# Patient Record
Sex: Male | Born: 1980 | Race: Black or African American | Hispanic: No | Marital: Single | Smoking: Current every day smoker
Health system: Southern US, Community
[De-identification: ages and names within clinical notes are randomized; demographics above are authoritative.]

---

## 2014-09-11 ENCOUNTER — Emergency Department (HOSPITAL_COMMUNITY)
Admission: EM | Admit: 2014-09-11 | Discharge: 2014-09-11 | Disposition: A | Discharge status: Home / Self Care | Payer: Self-pay | Attending: Emergency Medicine | Admitting: Emergency Medicine

## 2014-09-11 ENCOUNTER — Encounter (HOSPITAL_COMMUNITY): Payer: Self-pay

## 2014-09-11 DIAGNOSIS — M79672 Pain in left foot: Secondary | ICD-10-CM | POA: Insufficient documentation

## 2014-09-11 DIAGNOSIS — M79671 Pain in right foot: Secondary | ICD-10-CM | POA: Insufficient documentation

## 2014-09-11 DIAGNOSIS — Z720 Tobacco use: Secondary | ICD-10-CM | POA: Insufficient documentation

## 2014-09-11 LAB — CBG MONITORING, ED: Glucose-Capillary: 89 mg/dL (ref 65–99)

## 2014-09-11 MED ORDER — NAPROXEN 500 MG PO TABS: 500.0000 mg | ORAL_TABLET | Freq: Two times a day (BID) | ORAL | Status: DC

## 2014-09-11 NOTE — ED Provider Notes (Signed)
CSN: [id number]     Arrival date & time 09/11/14  1930 History   First MD Initiated Contact with Patient 09/11/14 1945     Chief Complaint  Patient presents with  . Foot Pain     (Consider location/radiation/quality/duration/timing/severity/associated sxs/prior Treatment) Patient is a 34 y.o. male presenting with lower extremity pain. The history is provided by the patient.  Foot Pain This is a chronic problem. The current episode started more than 1 year ago. The problem occurs constantly. The problem has been gradually worsening. The symptoms are aggravated by standing and walking. He has tried nothing for the symptoms.   Steve Flowers is a 34 y.o. male who presents to the ED with bilateral foot pain. He states that he has noted cracking of the skin in both heels. The left heel started about a year ago and now the right has started. He states the pain is getting worse.  He wears steel toe boots at work and is on his feet all day. He states that the pain is severe.   History reviewed. No pertinent past medical history. History reviewed. No pertinent past surgical history. No family history on file. History  Substance Use Topics  . Smoking status: Current Every Day Smoker -- 0.50 packs/day for 10 years    Types: Cigarettes  . Smokeless tobacco: Not on file  . Alcohol Use: Yes     Comment: occasional    Review of Systems Negative except as stated in HPI   Allergies  Review of patient's allergies indicates no known allergies.  Home Medications   Prior to Admission medications   Medication Sig Start Date End Date Taking? Authorizing Provider  naproxen (NAPROSYN) 500 MG tablet Take 1 tablet (500 mg total) by mouth 2 (two) times daily. 09/11/14   Hope Orlene OchM Neese, NP   BP 131/94 mmHg  Pulse 82  Temp(Src) 98.2 F (36.8 C) (Oral)  Resp 16  Ht 5\' 10"  (1.778 m)  Wt 432 lb 3 oz (196.039 kg)  BMI 62.01 kg/m2  SpO2 99% Physical Exam  Constitutional: He is oriented to person,  place, and time. No distress.  Morbidly obese  HENT:  Head: Normocephalic.  Eyes: EOM are normal.  Neck: Neck supple.  Cardiovascular: Normal rate.   Pulmonary/Chest: Effort normal.  Musculoskeletal: Normal range of motion.       Feet:  Bilateral heels dry with skin cracking and open areas. No erythema or signs of infection. Pedal pulses 2+ bilateral. Adequate circulation.   Neurological: He is alert and oriented to person, place, and time. No cranial nerve deficit.  Ambulatory without difficulty.   Skin: Skin is warm and dry.  Psychiatric: He has a normal mood and affect. His behavior is normal.  Nursing note and vitals reviewed.   ED Course  Procedures (including critical care time) Labs Review Results for orders placed or performed during the hospital encounter of 09/11/14 (from the past 24 hour(s))  POC CBG, ED     Status: None   Collection Time: 09/11/14  8:05 PM  Result Value Ref Range   Glucose-Capillary 89 65 - 99 mg/dL      MDM  34 y.o. male with bilateral heel pain and open dry areas. Stable for d/c without signs of infection. He will follow up with Dr. Charlsie Merlesegal for further evaluation of his feet.   Final diagnoses:  Heel pain, bilateral      [name], NP 09/11/14 2032  Geoffery Lyonsouglas Delo, MD 09/14/14 [phone]

## 2014-09-11 NOTE — ED Notes (Signed)
Patient states that both his feet ar cracking open on the heels. He has to wear steel toe boots at work and is on his feet a lot. Patient states that is is very painful.

## 2014-09-11 NOTE — Discharge Instructions (Signed)
Your exam today shows that the heels of your feet are dry and cracking open. They do not appear infected at this time. Be sure you are wearing shoes that fit properly. You may try the gel inserts for comfort. Apply cream to the heels at least twice a day. Call Dr. Charlsie Merlesegal for a follow up appointment. Return as needed.

## 2016-04-10 ENCOUNTER — Emergency Department (HOSPITAL_COMMUNITY): Payer: Self-pay

## 2016-04-10 ENCOUNTER — Encounter (HOSPITAL_COMMUNITY): Payer: Self-pay

## 2016-04-10 ENCOUNTER — Emergency Department (HOSPITAL_COMMUNITY)
Admission: EM | Admit: 2016-04-10 | Discharge: 2016-04-10 | Disposition: A | Discharge status: Home / Self Care | Payer: Self-pay | Attending: Emergency Medicine | Admitting: Emergency Medicine

## 2016-04-10 DIAGNOSIS — R110 Nausea: Secondary | ICD-10-CM | POA: Insufficient documentation

## 2016-04-10 DIAGNOSIS — Z79899 Other long term (current) drug therapy: Secondary | ICD-10-CM | POA: Insufficient documentation

## 2016-04-10 DIAGNOSIS — F17210 Nicotine dependence, cigarettes, uncomplicated: Secondary | ICD-10-CM | POA: Insufficient documentation

## 2016-04-10 DIAGNOSIS — R109 Unspecified abdominal pain: Secondary | ICD-10-CM

## 2016-04-10 DIAGNOSIS — R1011 Right upper quadrant pain: Secondary | ICD-10-CM | POA: Insufficient documentation

## 2016-04-10 LAB — CBC WITH DIFFERENTIAL/PLATELET
Basophils Absolute: 0 10*3/uL (ref 0.0–0.1)
Basophils Relative: 1 %
EOS PCT: 3 %
Eosinophils Absolute: 0.3 10*3/uL (ref 0.0–0.7)
HCT: 44.7 % (ref 39.0–52.0)
Hemoglobin: 15 g/dL (ref 13.0–17.0)
LYMPHS ABS: 3.1 10*3/uL (ref 0.7–4.0)
Lymphocytes Relative: 36 %
MCH: 29.6 pg (ref 26.0–34.0)
MCHC: 33.6 g/dL (ref 30.0–36.0)
MCV: 88.3 fL (ref 78.0–100.0)
MONOS PCT: 10 %
Monocytes Absolute: 0.8 10*3/uL (ref 0.1–1.0)
Neutro Abs: 4.3 10*3/uL (ref 1.7–7.7)
Neutrophils Relative %: 50 %
PLATELETS: 260 10*3/uL (ref 150–400)
RBC: 5.06 MIL/uL (ref 4.22–5.81)
RDW: 13.2 % (ref 11.5–15.5)
WBC: 8.5 10*3/uL (ref 4.0–10.5)

## 2016-04-10 LAB — COMPREHENSIVE METABOLIC PANEL
ALK PHOS: 36 U/L — AB (ref 38–126)
ALT: 28 U/L (ref 17–63)
AST: 23 U/L (ref 15–41)
Albumin: 4.2 g/dL (ref 3.5–5.0)
Anion gap: 7 (ref 5–15)
BUN: 9 mg/dL (ref 6–20)
CALCIUM: 9.1 mg/dL (ref 8.9–10.3)
CHLORIDE: 101 mmol/L (ref 101–111)
CO2: 29 mmol/L (ref 22–32)
CREATININE: 0.91 mg/dL (ref 0.61–1.24)
Glucose, Bld: 92 mg/dL (ref 65–99)
Potassium: 3.6 mmol/L (ref 3.5–5.1)
Sodium: 137 mmol/L (ref 135–145)
Total Bilirubin: 0.3 mg/dL (ref 0.3–1.2)
Total Protein: 7.5 g/dL (ref 6.5–8.1)

## 2016-04-10 LAB — LIPASE, BLOOD: LIPASE: 30 U/L (ref 11–51)

## 2016-04-10 MED ORDER — ONDANSETRON HCL 4 MG PO TABS: 4.0000 mg | ORAL_TABLET | Freq: Three times a day (TID) | ORAL | 0 refills | Status: AC | PRN

## 2016-04-10 MED ORDER — MORPHINE SULFATE (PF) 2 MG/ML IV SOLN
2.0000 mg | INTRAVENOUS | Status: DC | PRN
  Administered 2016-04-10: 2 mg via INTRAVENOUS
  Filled 2016-04-10: qty 1

## 2016-04-10 MED ORDER — IOPAMIDOL (ISOVUE-300) INJECTION 61%
125.0000 mL | Freq: Once | INTRAVENOUS | Status: AC | PRN
  Administered 2016-04-10: 125 mL via INTRAVENOUS

## 2016-04-10 MED ORDER — IOPAMIDOL (ISOVUE-300) INJECTION 61%
INTRAVENOUS | Status: AC
  Administered 2016-04-10: 30 mL via ORAL
  Filled 2016-04-10: qty 30

## 2016-04-10 MED ORDER — ONDANSETRON HCL 4 MG/2ML IJ SOLN
4.0000 mg | INTRAMUSCULAR | Status: DC | PRN
  Administered 2016-04-10: 4 mg via INTRAVENOUS
  Filled 2016-04-10: qty 2

## 2016-04-10 MED ORDER — FAMOTIDINE 20 MG PO TABS: 20.0000 mg | ORAL_TABLET | Freq: Two times a day (BID) | ORAL | 0 refills | Status: AC

## 2016-04-10 NOTE — Discharge Instructions (Signed)
Eat a bland diet, avoiding greasy, fatty, fried foods, as well as spicy and acidic foods or beverages.  Avoid eating within the hour or 2 before going to bed or laying down.  Also avoid teas, colas, coffee, chocolate, pepermint and spearment. Ttake over the counter maalox/mylanta, as directed on packaging, as needed for discomfort.  Take the prescriptions as directed.  Call your regular medical doctor and the GI doctor tomorrow to schedule a follow up appointment this week.  Return to the Emergency Department immediately if worsening.

## 2016-04-10 NOTE — ED Triage Notes (Signed)
Pt reports pain in RUQ x 1 week.  Reports nausea at times, no vomiting.  Denies diarrhea.  Reports has been constipated and has been taking laxatives.  Pt says finally had BMs yesterday.  Pt says he went 3 times.

## 2016-04-10 NOTE — ED Provider Notes (Signed)
[facility] DEPT Provider Note   CSN: [id number] Arrival date & time: 04/10/16  1446     History   Chief Complaint Chief Complaint  Patient presents with  . Abdominal Pain    HPI Steve Flowers is a 35 y.o. male.  HPI  Pt was seen at 1550.  Per pt, c/o gradual onset and worsening of constant RUQ abd "pain" for the past 4 days.  Has been associated with nausea.  Pain worsens after eating. Pt states he took a laxative and had resultant BM without change in his symptoms. Denies vomiting/diarrhea, no fevers, no back pain, no rash, no CP/SOB, no black or blood in stools.      History reviewed. No pertinent past medical history.  There are no active problems to display for this patient.   History reviewed. No pertinent surgical history.     Home Medications    Prior to Admission medications   Medication Sig Start Date End Date Taking? Authorizing Provider  naproxen (NAPROSYN) 500 MG tablet Take 1 tablet (500 mg total) by mouth 2 (two) times daily. 09/11/14   Hope Orlene OchM Neese, NP    Family History No family history on file.  Social History Social History  Substance Use Topics  . Smoking status: Current Every Day Smoker    Packs/day: 0.50    Years: 10.00    Types: Cigarettes  . Smokeless tobacco: Never Used  . Alcohol use Yes     Comment: occasional     Allergies   Patient has no known allergies.   Review of Systems Review of Systems ROS: Statement: All systems negative except as marked or noted in the HPI; Constitutional: Negative for fever and chills. ; ; Eyes: Negative for eye pain, redness and discharge. ; ; ENMT: Negative for ear pain, hoarseness, nasal congestion, sinus pressure and sore throat. ; ; Cardiovascular: Negative for chest pain, palpitations, diaphoresis, dyspnea and peripheral edema. ; ; Respiratory: Negative for cough, wheezing and stridor. ; ; Gastrointestinal: +abd pain, nausea. Negative for vomiting, diarrhea, blood in stool, hematemesis,  jaundice and rectal bleeding. . ; ; Genitourinary: Negative for dysuria, flank pain and hematuria. ; ; Musculoskeletal: Negative for back pain and neck pain. Negative for swelling and trauma.; ; Skin: Negative for pruritus, rash, abrasions, blisters, bruising and skin lesion.; ; Neuro: Negative for headache, lightheadedness and neck stiffness. Negative for weakness, altered level of consciousness, altered mental status, extremity weakness, paresthesias, involuntary movement, seizure and syncope.       Physical Exam Updated Vital Signs BP (!) 155/101 (BP Location: Left Arm)   Pulse 85   Temp 97.5 F (36.4 C) (Oral)   Resp 18   Ht 5\' 10"  (1.778 m)   Wt (!) 410 lb (186 kg)   SpO2 100%   BMI 58.83 kg/m   Physical Exam 1555: Physical examination:  Nursing notes reviewed; Vital signs and O2 SAT reviewed;  Constitutional: Well developed, Well nourished, Well hydrated, In no acute distress; Head:  Normocephalic, atraumatic; Eyes: EOMI, PERRL, No scleral icterus; ENMT: Mouth and pharynx normal, Mucous membranes moist; Neck: Supple, Full range of motion, No lymphadenopathy; Cardiovascular: Regular rate and rhythm, No gallop; Respiratory: Breath sounds clear & equal bilaterally, No wheezes.  Speaking full sentences with ease, Normal respiratory effort/excursion; Chest: Nontender, Movement normal; Abdomen: Large, Soft, +RUQ tenderness to palp. Nondistended, Normal bowel sounds; Genitourinary: No CVA tenderness; Extremities: Pulses normal, No tenderness, No edema, No calf edema or asymmetry.; Neuro: AA&Ox3, Major CN grossly intact.  Speech  clear. No gross focal motor or sensory deficits in extremities.; Skin: Color normal, Warm, Dry.   ED Treatments / Results  Labs (all labs ordered are listed, but only abnormal results are displayed)   EKG  EKG Interpretation None       Radiology   Procedures Procedures (including critical care time)  Medications Ordered in ED Medications  ondansetron  (ZOFRAN) injection 4 mg (not administered)  morphine 2 MG/ML injection 2 mg (not administered)     Initial Impression / Assessment and Plan / ED Course  I have reviewed the triage vital signs and the nursing notes.  Pertinent labs & imaging results that were available during my care of the patient were reviewed by me and considered in my medical decision making (see chart for details).  MDM Reviewed: previous chart, nursing note and vitals Reviewed previous: labs Interpretation: labs and CT scan   Results for orders placed or performed during the hospital encounter of 04/10/16  CBC with Differential  Result Value Ref Range   WBC 8.5 4.0 - 10.5 K/uL   RBC 5.06 4.22 - 5.81 MIL/uL   Hemoglobin 15.0 13.0 - 17.0 g/dL   HCT 16.1 09.6 - 04.5 %   MCV 88.3 78.0 - 100.0 fL   MCH 29.6 26.0 - 34.0 pg   MCHC 33.6 30.0 - 36.0 g/dL   RDW 40.9 81.1 - 91.4 %   Platelets 260 150 - 400 K/uL   Neutrophils Relative % 50 %   Neutro Abs 4.3 1.7 - 7.7 K/uL   Lymphocytes Relative 36 %   Lymphs Abs 3.1 0.7 - 4.0 K/uL   Monocytes Relative 10 %   Monocytes Absolute 0.8 0.1 - 1.0 K/uL   Eosinophils Relative 3 %   Eosinophils Absolute 0.3 0.0 - 0.7 K/uL   Basophils Relative 1 %   Basophils Absolute 0.0 0.0 - 0.1 K/uL  Comprehensive metabolic panel  Result Value Ref Range   Sodium 137 135 - 145 mmol/L   Potassium 3.6 3.5 - 5.1 mmol/L   Chloride 101 101 - 111 mmol/L   CO2 29 22 - 32 mmol/L   Glucose, Bld 92 65 - 99 mg/dL   BUN 9 6 - 20 mg/dL   Creatinine, Ser 7.82 0.61 - 1.24 mg/dL   Calcium 9.1 8.9 - 95.6 mg/dL   Total Protein 7.5 6.5 - 8.1 g/dL   Albumin 4.2 3.5 - 5.0 g/dL   AST 23 15 - 41 U/L   ALT 28 17 - 63 U/L   Alkaline Phosphatase 36 (L) 38 - 126 U/L   Total Bilirubin 0.3 0.3 - 1.2 mg/dL   GFR calc non Af Amer >60 >60 mL/min   GFR calc Af Amer >60 >60 mL/min   Anion gap 7 5 - 15  Lipase, blood  Result Value Ref Range   Lipase 30 11 - 51 U/L   Ct Abdomen Pelvis W Contrast Result  Date: 04/10/2016 CLINICAL DATA:  Right abdominal pain for 1 week. EXAM: CT ABDOMEN AND PELVIS WITH CONTRAST TECHNIQUE: Multidetector CT imaging of the abdomen and pelvis was performed using the standard protocol following bolus administration of intravenous contrast. CONTRAST:  125 cc ISOVUE-300 IOPAMIDOL (ISOVUE-300) INJECTION 61%, ISOVUE-300 IOPAMIDOL (ISOVUE-300) INJECTION 61% COMPARISON:  None. FINDINGS: Lower chest: No significant pulmonary nodules or acute consolidative airspace disease. Hepatobiliary: Diffuse hepatic steatosis. Normal liver size. No liver mass. No definite liver surface irregularity. Normal gallbladder with no radiopaque cholelithiasis. No biliary ductal dilatation. Pancreas: Normal, with no  mass or duct dilation. Spleen: Normal size. No mass. Adrenals/Urinary Tract: Normal adrenals. Normal kidneys with no hydronephrosis and no renal mass. Normal bladder. Stomach/Bowel: Grossly normal stomach. Normal caliber small bowel with no small bowel wall thickening. Normal appendix. Normal large bowel with no diverticulosis, large bowel wall thickening or pericolonic fat stranding. Vascular/Lymphatic: Normal caliber abdominal aorta. Patent portal, splenic, hepatic and renal veins. No pathologically enlarged lymph nodes in the abdomen or pelvis. Reproductive: Normal size prostate. Other: No pneumoperitoneum, ascites or focal fluid collection. Small fat containing umbilical hernia. Musculoskeletal: No aggressive appearing focal osseous lesions. Minimal thoracolumbar spondylosis Symmetric mild gynecomastia. IMPRESSION: 1. No acute abnormality. No evidence of bowel obstruction or acute bowel inflammation. Normal appendix. 2. Diffuse hepatic steatosis . 3. Small fat containing umbilical hernia. Electronically Signed   By: Delbert PhenixJason A Poff M.D.   On: 04/10/2016 18:03    1930:  Workup reassuring. Has tol PO well while in the ED without N/V. No stooling while in the ED. VS remain stable. Tx  symptomatically at this time. Dx and testing d/w pt and family.  Questions answered.  Verb understanding, agreeable to d/c home with outpt f/u.     Final Clinical Impressions(s) / ED Diagnoses   Final diagnoses:  None    New Prescriptions New Prescriptions   No medications on file     Samuel JesterKathleen Errin Whitelaw, DO 04/14/16 2053

## 2016-04-10 NOTE — ED Notes (Signed)
Pt informed of need for urine sample, unable to urinate at this time but will provide one when able.

## 2016-10-26 ENCOUNTER — Emergency Department (HOSPITAL_COMMUNITY)
Admission: EM | Admit: 2016-10-26 | Discharge: 2016-10-27 | Disposition: A | Discharge status: Home / Self Care | Payer: Self-pay | Attending: Emergency Medicine | Admitting: Emergency Medicine

## 2016-10-26 ENCOUNTER — Emergency Department (HOSPITAL_COMMUNITY): Payer: Self-pay

## 2016-10-26 ENCOUNTER — Encounter (HOSPITAL_COMMUNITY): Payer: Self-pay | Admitting: *Deleted

## 2016-10-26 DIAGNOSIS — Z79899 Other long term (current) drug therapy: Secondary | ICD-10-CM | POA: Insufficient documentation

## 2016-10-26 DIAGNOSIS — J209 Acute bronchitis, unspecified: Secondary | ICD-10-CM | POA: Insufficient documentation

## 2016-10-26 DIAGNOSIS — F17210 Nicotine dependence, cigarettes, uncomplicated: Secondary | ICD-10-CM | POA: Insufficient documentation

## 2016-10-26 MED ORDER — ALBUTEROL SULFATE (2.5 MG/3ML) 0.083% IN NEBU
5.0000 mg | INHALATION_SOLUTION | Freq: Once | RESPIRATORY_TRACT | Status: AC
  Administered 2016-10-26: 5 mg via RESPIRATORY_TRACT
  Filled 2016-10-26: qty 6

## 2016-10-26 NOTE — ED Triage Notes (Signed)
Pt c/o cough that is productive with white sputum production for the past few days and sob,

## 2016-10-27 MED ORDER — ALBUTEROL SULFATE (2.5 MG/3ML) 0.083% IN NEBU
2.5000 mg | INHALATION_SOLUTION | Freq: Once | RESPIRATORY_TRACT | Status: AC
  Administered 2016-10-27: 2.5 mg via RESPIRATORY_TRACT
  Filled 2016-10-27: qty 3

## 2016-10-27 MED ORDER — ALBUTEROL SULFATE (2.5 MG/3ML) 0.083% IN NEBU: 2.5000 mg | INHALATION_SOLUTION | RESPIRATORY_TRACT | 0 refills | Status: AC | PRN

## 2016-10-27 MED ORDER — DOXYCYCLINE HYCLATE 100 MG PO TABS
100.0000 mg | ORAL_TABLET | Freq: Once | ORAL | Status: AC
  Administered 2016-10-27: 100 mg via ORAL
  Filled 2016-10-27: qty 1

## 2016-10-27 MED ORDER — PROMETHAZINE-DM 6.25-15 MG/5ML PO SYRP: 5.0000 mL | ORAL_SOLUTION | Freq: Four times a day (QID) | ORAL | 0 refills | Status: AC | PRN

## 2016-10-27 MED ORDER — AZITHROMYCIN 250 MG PO TABS: 250.0000 mg | ORAL_TABLET | Freq: Every day | ORAL | 0 refills | Status: AC

## 2016-10-27 MED ORDER — DEXAMETHASONE 4 MG PO TABS: 4.0000 mg | ORAL_TABLET | Freq: Two times a day (BID) | ORAL | 0 refills | Status: AC

## 2016-10-27 MED ORDER — ALBUTEROL SULFATE (2.5 MG/3ML) 0.083% IN NEBU: 2.5000 mg | INHALATION_SOLUTION | RESPIRATORY_TRACT | 0 refills | Status: DC | PRN

## 2016-10-27 MED ORDER — ALBUTEROL SULFATE HFA 108 (90 BASE) MCG/ACT IN AERS
2.0000 | INHALATION_SPRAY | Freq: Once | RESPIRATORY_TRACT | Status: AC
  Administered 2016-10-27: 2 via RESPIRATORY_TRACT
  Filled 2016-10-27: qty 6.7

## 2016-10-27 MED ORDER — HYDROCOD POLST-CPM POLST ER 10-8 MG/5ML PO SUER
5.0000 mL | Freq: Once | ORAL | Status: AC
  Administered 2016-10-27: 5 mL via ORAL
  Filled 2016-10-27: qty 5

## 2016-10-27 MED ORDER — DEXAMETHASONE SODIUM PHOSPHATE 4 MG/ML IJ SOLN
8.0000 mg | Freq: Once | INTRAMUSCULAR | Status: AC
  Administered 2016-10-27: 8 mg via INTRAMUSCULAR
  Filled 2016-10-27: qty 2

## 2016-10-27 NOTE — ED Provider Notes (Signed)
[facility] Provider Note   CSN: [id number] Arrival date & time: 10/26/16  2018     History   Chief Complaint Chief Complaint  Patient presents with  . Cough    HPI Steve Flowers is a 36 y.o. male.  HPI  History reviewed. No pertinent past medical history.  There are no active problems to display for this patient.   History reviewed. No pertinent surgical history.     Home Medications    Prior to Admission medications   Medication Sig Start Date End Date Taking? Authorizing Provider  acetaminophen (TYLENOL) 500 MG tablet Take 500 mg by mouth every 6 (six) hours as needed for mild pain.    [provider]  famotidine (PEPCID) 20 MG tablet Take 1 tablet (20 mg total) by mouth 2 (two) times daily. 04/10/16   Samuel JesterMcManus, Kathleen, DO  ondansetron (ZOFRAN) 4 MG tablet Take 1 tablet (4 mg total) by mouth every 8 (eight) hours as needed for nausea or vomiting. 04/10/16   Samuel JesterMcManus, Kathleen, DO  Sennosides (EX-LAX PO) Take 1-2 capsules by mouth 2 (two) times daily as needed (for constipation).    [provider]    Family History No family history on file.  Social History Social History  Substance Use Topics  . Smoking status: Current Every Day Smoker    Packs/day: 0.50    Years: 10.00    Types: Cigarettes  . Smokeless tobacco: Never Used  . Alcohol use Yes     Comment: occasional     Allergies   Patient has no known allergies.   Review of Systems Review of Systems   Physical Exam Updated Vital Signs BP (!) 142/85 (BP Location: Right Arm)   Pulse 83   Temp 98.1 F (36.7 C) (Oral)   Resp 18   Ht 5\' 10"  (1.778 m)   Wt (!) 186 kg (410 lb)   SpO2 95%   BMI 58.83 kg/m   Physical Exam  Constitutional: He is oriented to person, place, and time. He appears well-developed and well-nourished.  Non-toxic appearance.  Obesity  HENT:  Head: Normocephalic.  Right Ear: Tympanic membrane and external ear normal.  Left Ear: Tympanic  membrane and external ear normal.  Eyes: EOM and lids are normal. Pupils are equal, round, and reactive to light.  Neck: Normal range of motion. Neck supple. Carotid bruit is not present.  Cardiovascular: Normal rate, regular rhythm, normal heart sounds, intact distal pulses and normal pulses.   Pulmonary/Chest: Breath sounds normal. No respiratory distress.  Patient has bilateral wheezes present. There is symmetrical rise and fall of the chest. The patient speaks in complete sentences without problem.  Abdominal: Soft. Bowel sounds are normal. There is no tenderness. There is no guarding.  Musculoskeletal: Normal range of motion.  Lymphadenopathy:       Head (right side): No submandibular adenopathy present.       Head (left side): No submandibular adenopathy present.    He has no cervical adenopathy.  Neurological: He is alert and oriented to person, place, and time. He has normal strength. No cranial nerve deficit or sensory deficit.  Skin: Skin is warm and dry.  Psychiatric: He has a normal mood and affect. His speech is normal.  Nursing note and vitals reviewed.    ED Treatments / Results  Labs (all labs ordered are listed, but only abnormal results are displayed) Labs Reviewed - No data to display  EKG  EKG Interpretation None  Radiology Dg Chest 2 View  Result Date: 10/26/2016 CLINICAL DATA:  Productive cough and dyspnea tonight. EXAM: CHEST  2 VIEW COMPARISON:  None. FINDINGS: Linear scarring or atelectasis in the right upper lobe and right middle lobe. The left lung is clear. No confluent consolidation. No pleural effusions. Normal pulmonary vasculature. Hilar, mediastinal and cardiac contours are unremarkable. IMPRESSION: Linear scarring or atelectasis. No confluent consolidation. No effusion. Electronically Signed   By: Ellery Plunk M.D.   On: 10/26/2016 21:14    Procedures Procedures (including critical care time)  Medications Ordered in ED Medications    albuterol (PROVENTIL) (2.5 MG/3ML) 0.083% nebulizer solution 5 mg (5 mg Nebulization Given 10/26/16 2145)     Initial Impression / Assessment and Plan / ED Course  I have reviewed the triage vital signs and the nursing notes.  Pertinent labs & imaging results that were available during my care of the patient were reviewed by me and considered in my medical decision making (see chart for details).      Final Clinical Impressions(s) / ED Diagnoses MDM Vital signs reviewed. Pulse oximetry has been 94-95% on room air.  Patient breathing a little easier after additional nebulizer treatment, but continues to wheeze and have some cough. Patient treated with a second nebulizer treatment as well as Tussionex.  Patient feels he can manage the cough and wheeze at home. Patient given an albuterol inhaler. He will be treated with Zithromax. The patient was given an injection of steroid in the emergency department. He will return to the emergency department or see his primary physician if not improving.    Final diagnoses:  Acute bronchitis, unspecified organism    New Prescriptions New Prescriptions   AZITHROMYCIN (ZITHROMAX) 250 MG TABLET    Take 1 tablet (250 mg total) by mouth daily. Take first 2 tablets together, then 1 every day until finished.   DEXAMETHASONE (DECADRON) 4 MG TABLET    Take 1 tablet (4 mg total) by mouth 2 (two) times daily with a meal.   PROMETHAZINE-DEXTROMETHORPHAN (PROMETHAZINE-DM) 6.25-15 MG/5ML SYRUP    Take 5 mLs by mouth 4 (four) times daily as needed for cough.     Ivery Quale, PA-C 10/27/16 1610    Glynn Octave, MD 10/27/16 [phone]

## 2016-10-27 NOTE — Discharge Instructions (Signed)
Vital signs within normal limits. Your chest x-ray is negative for pneumonia, does suggest some bronchitis. Please use 2 puffs of albuterol every 4 hours for assistance with breathing and wheezing. Use promethazine DM for cough. This medication may cause drowsiness, please use with caution. Please use 2 tablets of Zithromax on day 1, and then 1 tablet daily until all taken. Please stop smoking.

## 2016-10-27 NOTE — ED Notes (Signed)
ED Provider at bedside. 

## 2017-06-27 IMAGING — CT CT ABD-PELV W/ CM
2 of 4 series · 16 of 46 positions shown, 18 images · IV contrast (Isovue)
Comparison: None.

CLINICAL DATA: Right abdominal pain for 1 week.

EXAM:
CT ABDOMEN AND PELVIS WITH CONTRAST
TECHNIQUE: Multidetector CT imaging of the abdomen and pelvis was performed
using the standard protocol following bolus administration of
intravenous contrast.
CONTRAST:  125 cc I26EK4-T77 IOPAMIDOL (I26EK4-T77) INJECTION 61%,
125mL I26EK4-T77 IOPAMIDOL (I26EK4-T77) INJECTION 61%

[Series 2: axial st · axial · 0.98mm/px · z∈[-483,+2]mm · 13 of 107 slices shown, 15 images]
[im 5/107  soft-tissue]
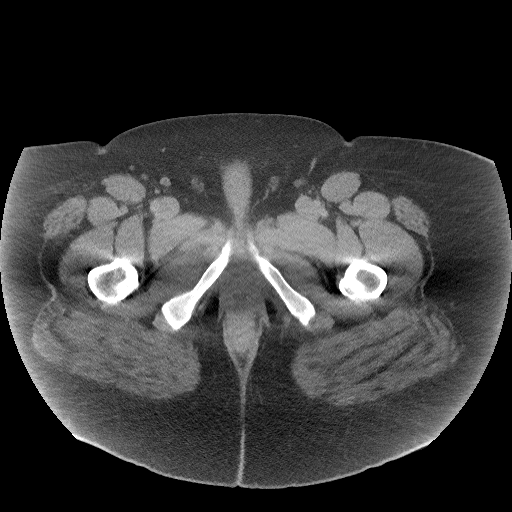
[im 5/107  bone]
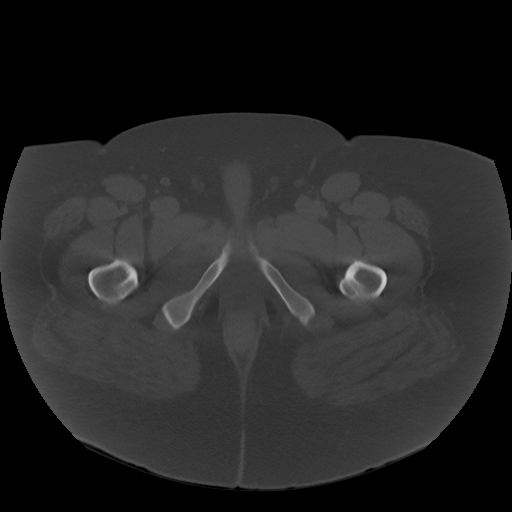
[im 14/107  soft-tissue]
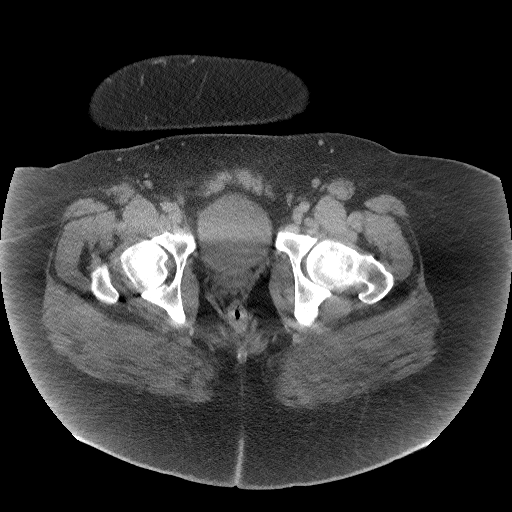
[im 24/107  soft-tissue]
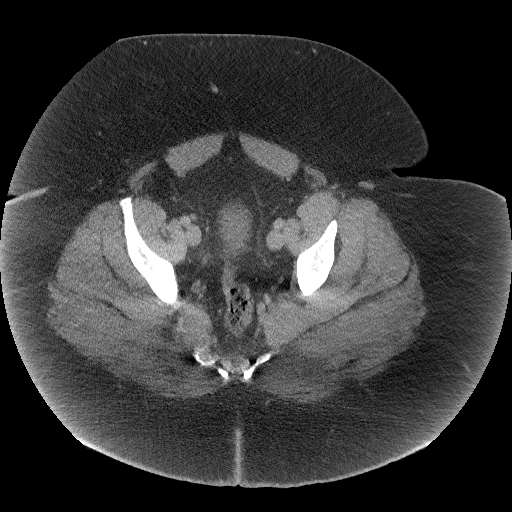
[im 28/107  soft-tissue]
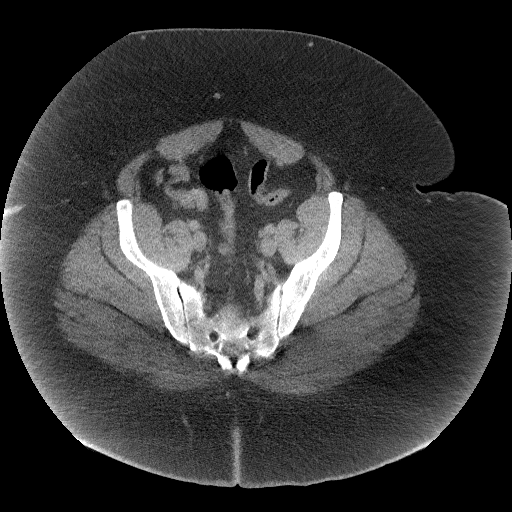
[im 37/107  soft-tissue]
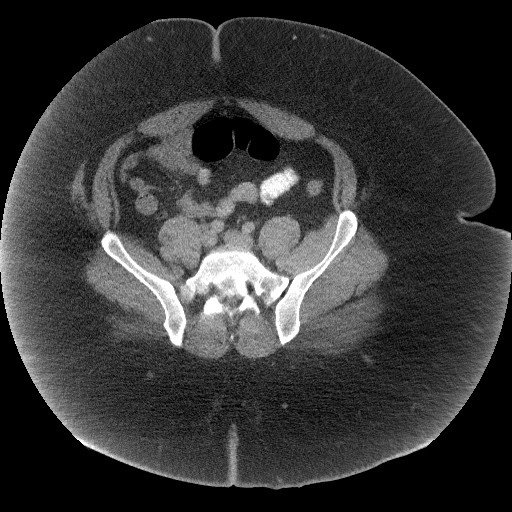
[im 47/107  soft-tissue]
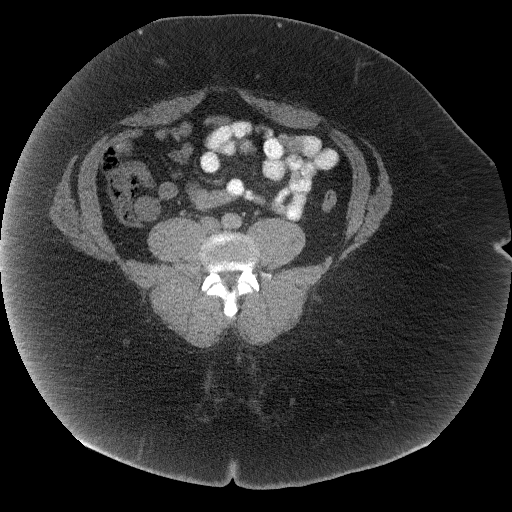
[im 56/107  soft-tissue]
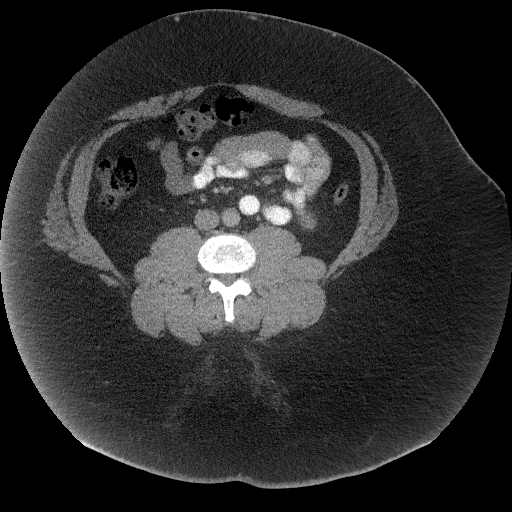
[im 60/107  soft-tissue]
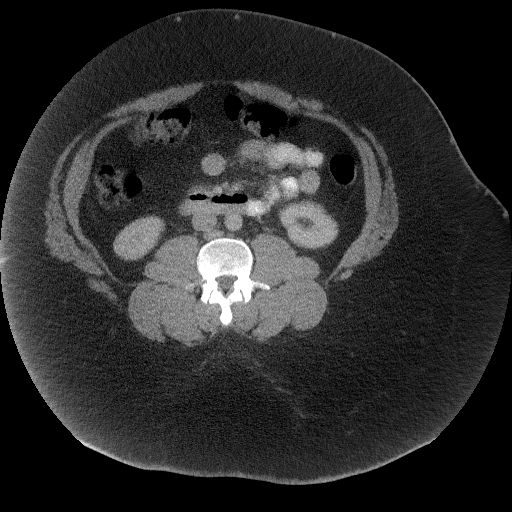
[im 70/107  soft-tissue]
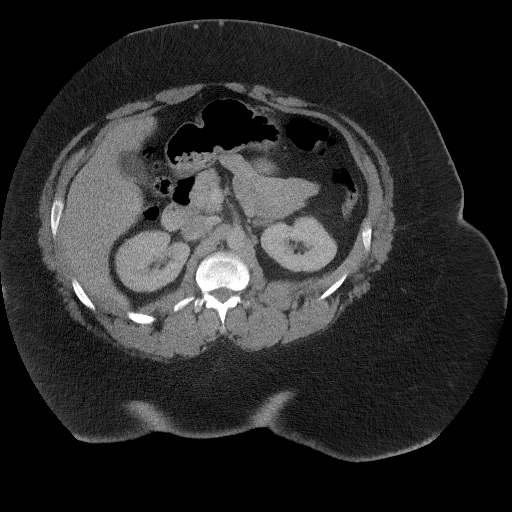
[im 70/107  bone]
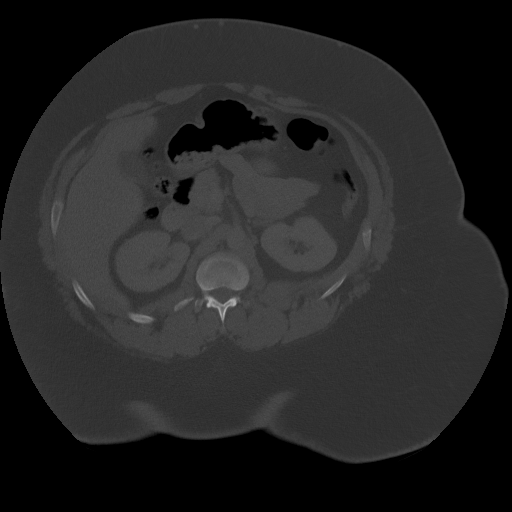
[im 79/107  soft-tissue]
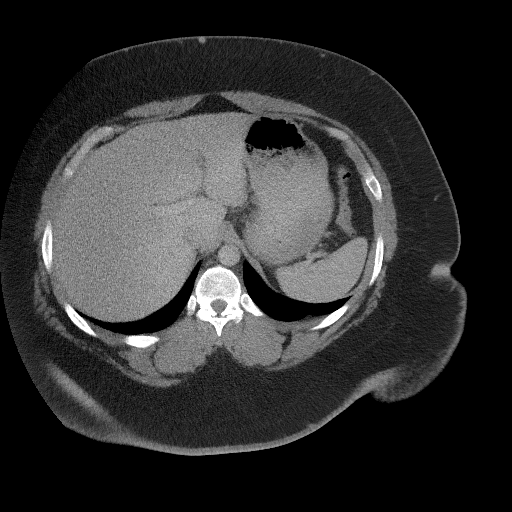
[im 83/107  soft-tissue]
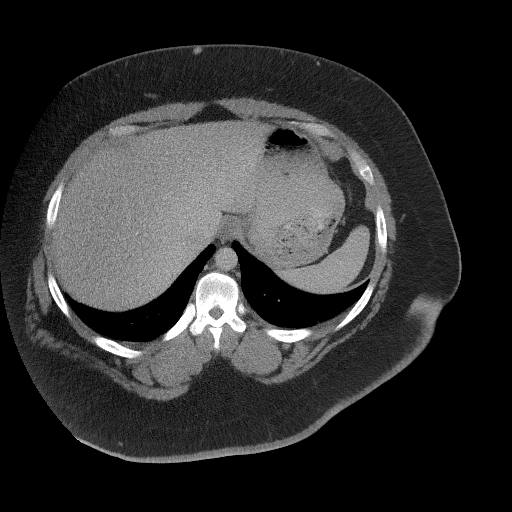
[im 93/107  soft-tissue]
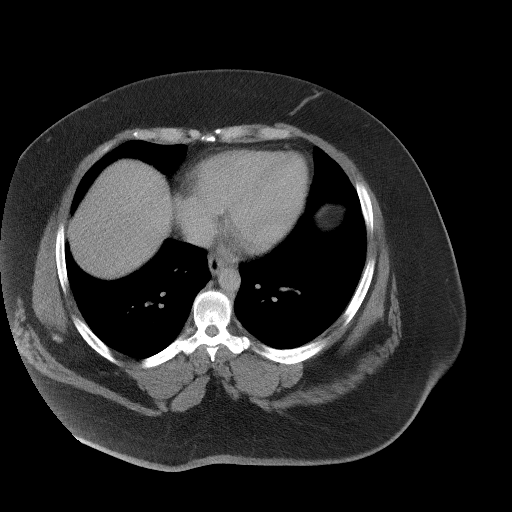
[im 102/107  soft-tissue]
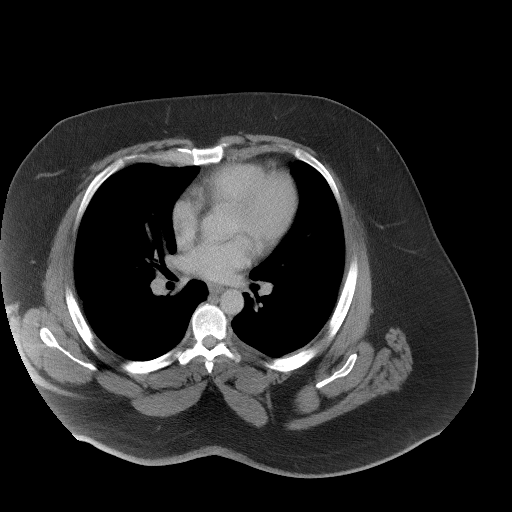

[Series 5: coronal st · coronal · 0.93mm/px · 3 of 136 slices shown]
[im 46/136  soft-tissue]
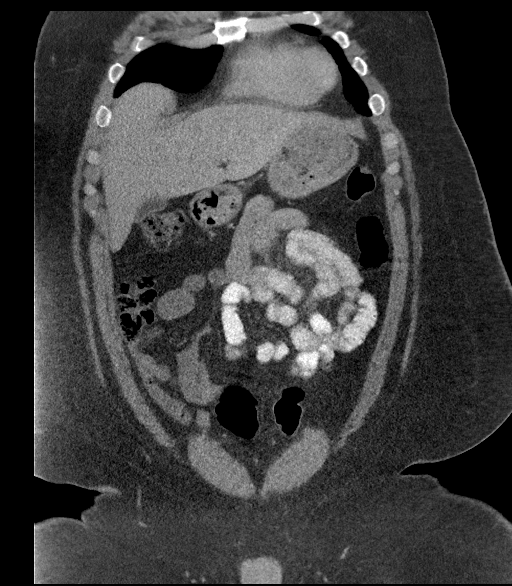
[im 61/136  soft-tissue]
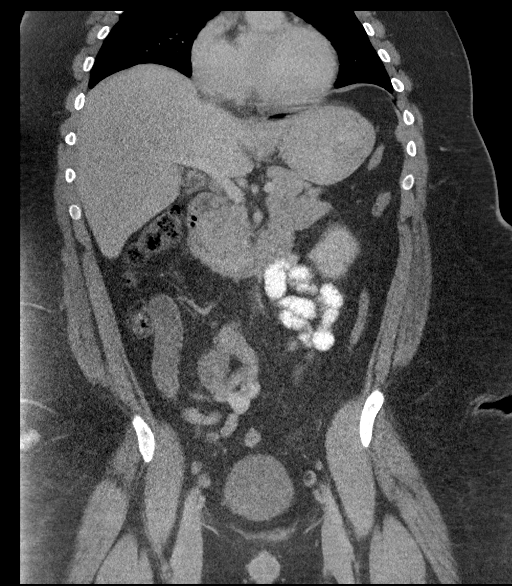
[im 76/136  soft-tissue]
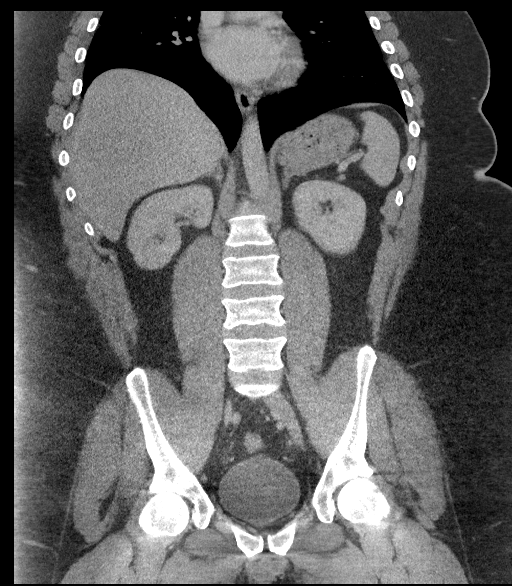

[16 of 46 positions shown; findings below may reference images not displayed]

FINDINGS: Lower chest: No significant pulmonary nodules or acute consolidative
airspace disease.

Hepatobiliary: Diffuse hepatic steatosis. Normal liver size. No
liver mass. No definite liver surface irregularity. Normal
gallbladder with no radiopaque cholelithiasis. No biliary ductal
dilatation.

Pancreas: Normal, with no mass or duct dilation.

Spleen: Normal size. No mass.

Adrenals/Urinary Tract: Normal adrenals. Normal kidneys with no
hydronephrosis and no renal mass. Normal bladder.

Stomach/Bowel: Grossly normal stomach. Normal caliber small bowel
with no small bowel wall thickening. Normal appendix. Normal large
bowel with no diverticulosis, large bowel wall thickening or
pericolonic fat stranding.

Vascular/Lymphatic: Normal caliber abdominal aorta. Patent portal,
splenic, hepatic and renal veins. No pathologically enlarged lymph
nodes in the abdomen or pelvis.

Reproductive: Normal size prostate.

Other: No pneumoperitoneum, ascites or focal fluid collection. Small
fat containing umbilical hernia.

Musculoskeletal: No aggressive appearing focal osseous lesions.
Minimal thoracolumbar spondylosis Symmetric mild gynecomastia.
IMPRESSION: 1. No acute abnormality. No evidence of bowel obstruction or acute
bowel inflammation. Normal appendix.
2. Diffuse hepatic steatosis .
3. Small fat containing umbilical hernia.

## 2018-01-12 IMAGING — DX DG CHEST 2V
2 series · 2 of 2 positions shown · non-contrast
Comparison: None.

CLINICAL DATA: Productive cough and dyspnea tonight.

EXAM:
CHEST  2 VIEW

[chest pa]
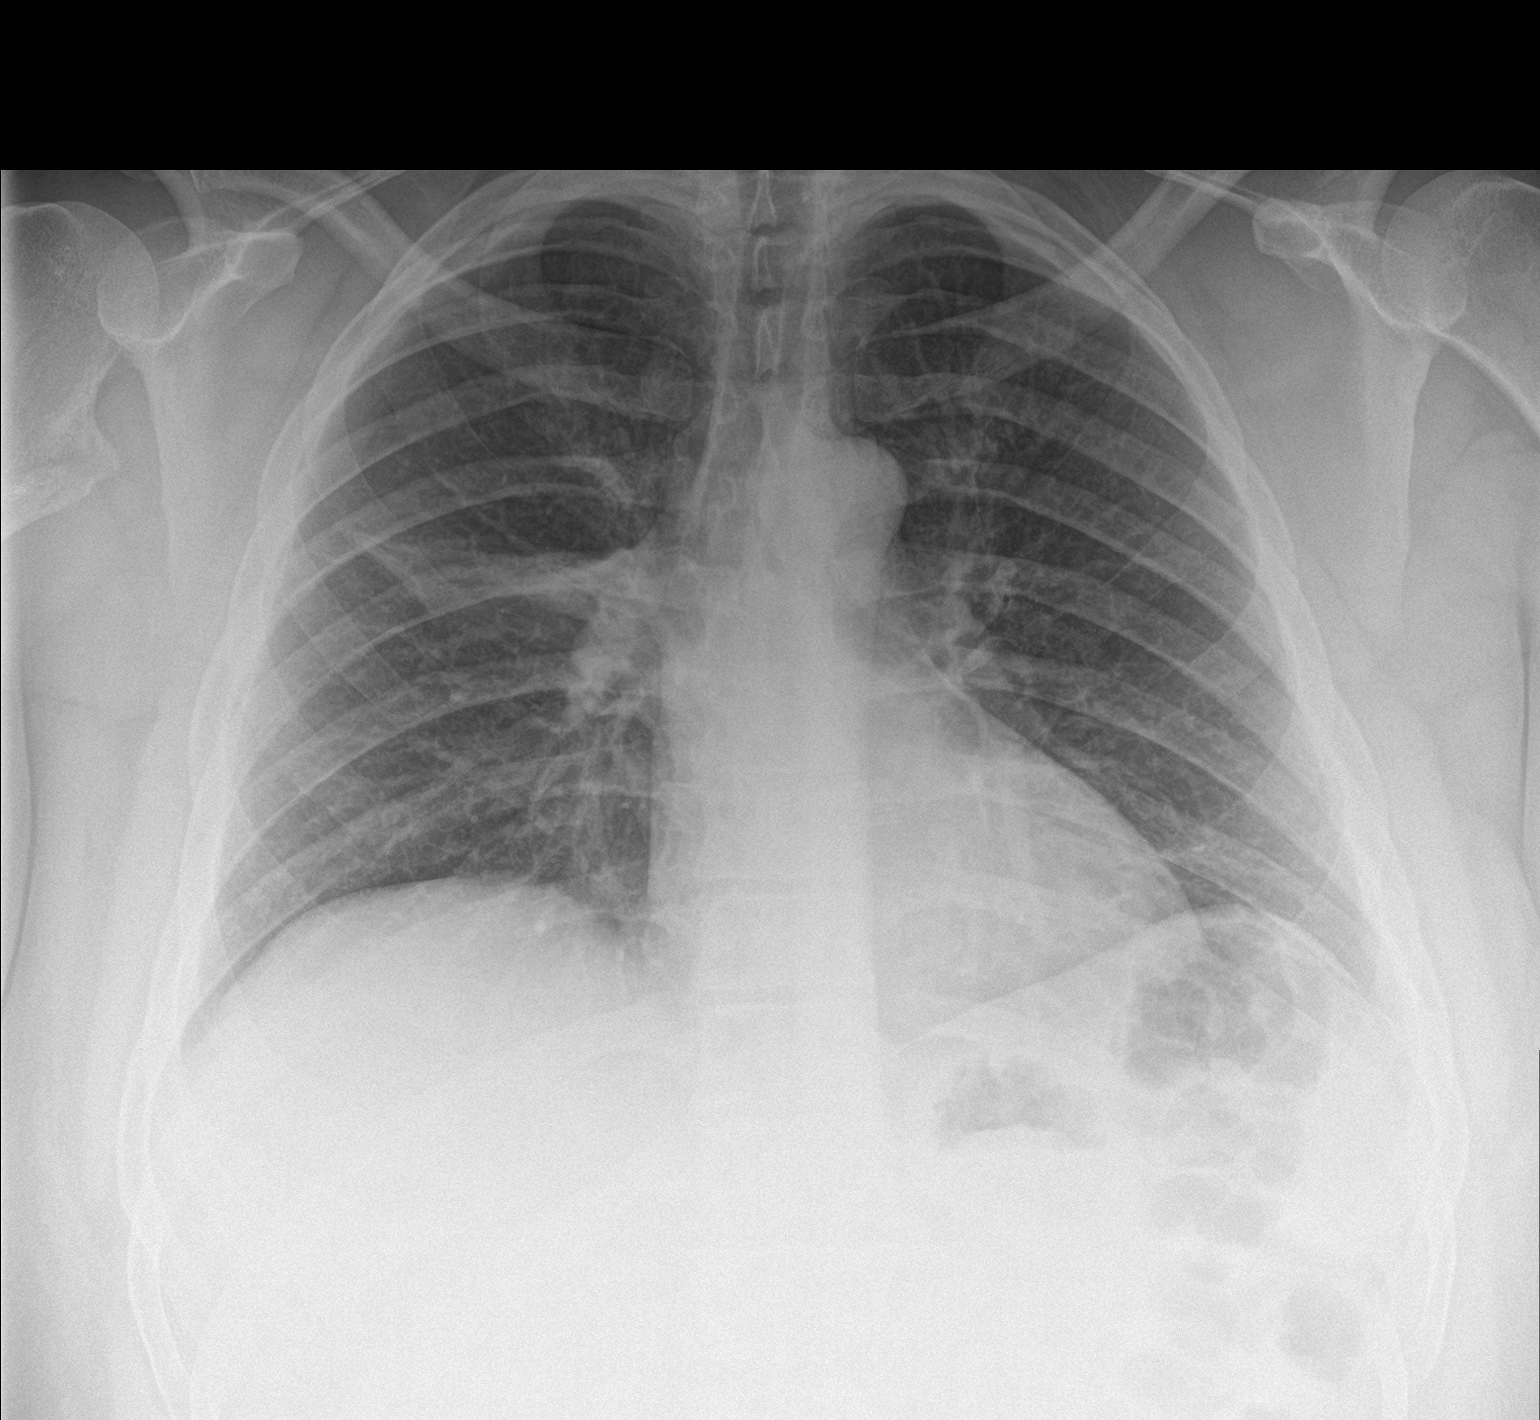

[chest lat]
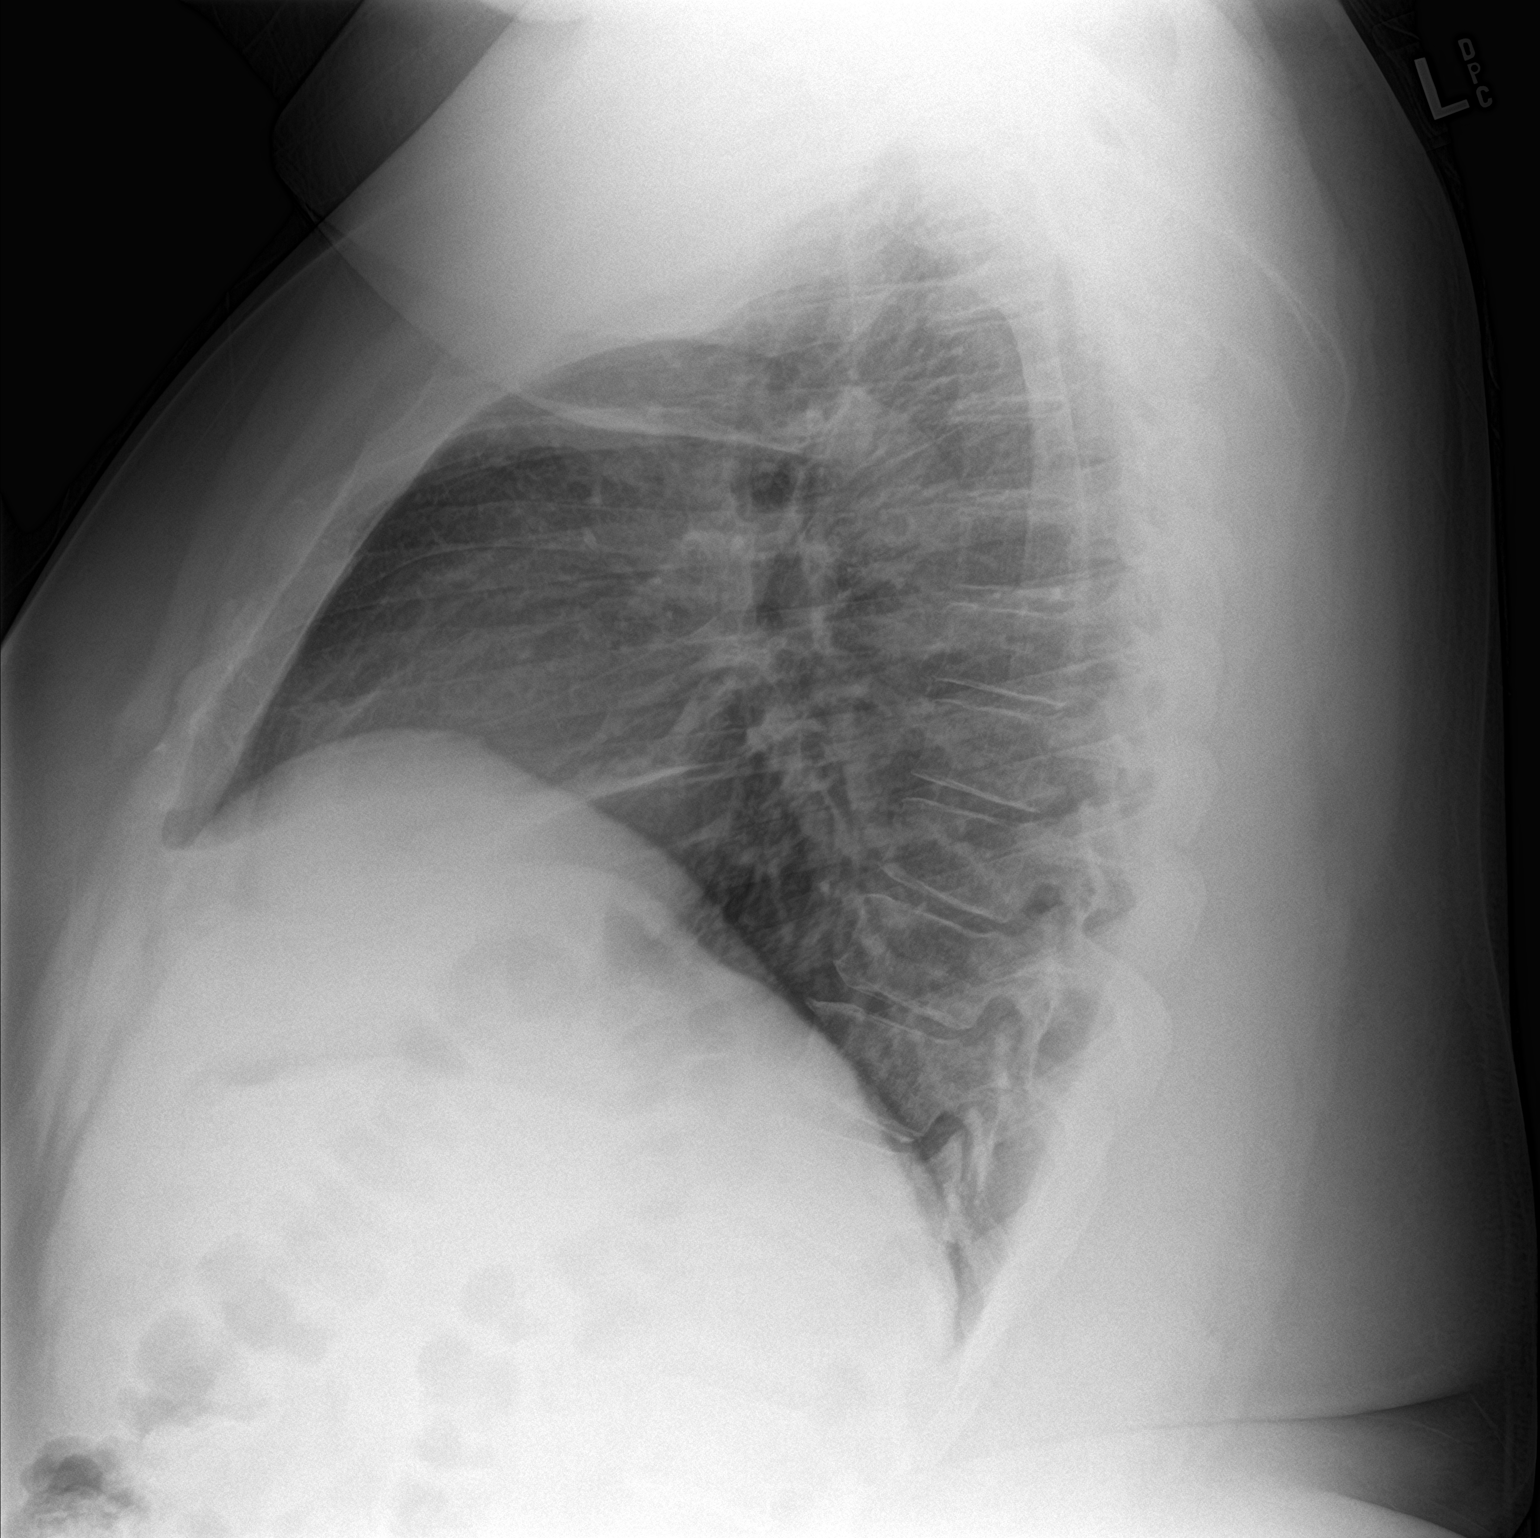

[2 of 2 positions shown; findings below may reference images not displayed]

FINDINGS: Linear scarring or atelectasis in the right upper lobe and right
middle lobe. The left lung is clear. No confluent consolidation. No
pleural effusions. Normal pulmonary vasculature. Hilar, mediastinal
and cardiac contours are unremarkable.
IMPRESSION: Linear scarring or atelectasis. No confluent consolidation. No
effusion.
